# Patient Record
Sex: Male | Born: 1965 | Race: White | Hispanic: No | Marital: Married | Smoking: Never smoker
Health system: Southern US, Community
[De-identification: ages and names within clinical notes are randomized; demographics above are authoritative.]

## PROBLEM LIST (undated history)

## (undated) DIAGNOSIS — I10 Essential (primary) hypertension: Secondary | ICD-10-CM

## (undated) DIAGNOSIS — E119 Type 2 diabetes mellitus without complications: Secondary | ICD-10-CM

## (undated) DIAGNOSIS — A770 Spotted fever due to Rickettsia rickettsii: Secondary | ICD-10-CM

## (undated) DIAGNOSIS — I82409 Acute embolism and thrombosis of unspecified deep veins of unspecified lower extremity: Secondary | ICD-10-CM

## (undated) HISTORY — DX: Spotted fever due to Rickettsia rickettsii: A77.0

## (undated) HISTORY — DX: Type 2 diabetes mellitus without complications: E11.9

## (undated) HISTORY — DX: Acute embolism and thrombosis of unspecified deep veins of unspecified lower extremity: I82.409

## (undated) HISTORY — PX: TYMPANOPLASTY: SHX33

## (undated) HISTORY — PX: TONSILLECTOMY: SUR1361

---

## 2014-10-13 ENCOUNTER — Emergency Department: Payer: Self-pay | Admitting: Emergency Medicine

## 2014-11-26 DIAGNOSIS — I82409 Acute embolism and thrombosis of unspecified deep veins of unspecified lower extremity: Secondary | ICD-10-CM

## 2014-11-26 HISTORY — DX: Acute embolism and thrombosis of unspecified deep veins of unspecified lower extremity: I82.409

## 2016-10-25 ENCOUNTER — Encounter: Payer: BLUE CROSS/BLUE SHIELD | Attending: Internal Medicine | Admitting: *Deleted

## 2016-10-25 ENCOUNTER — Encounter: Payer: Self-pay | Admitting: *Deleted

## 2016-10-25 VITALS — BP 150/92 | Ht 71.0 in | Wt 235.8 lb

## 2016-10-25 DIAGNOSIS — Z713 Dietary counseling and surveillance: Secondary | ICD-10-CM | POA: Insufficient documentation

## 2016-10-25 DIAGNOSIS — E119 Type 2 diabetes mellitus without complications: Secondary | ICD-10-CM | POA: Insufficient documentation

## 2016-10-25 NOTE — Progress Notes (Signed)
Diabetes Self-Management Education  Visit Type: First/Initial  Appt. Start Time: 1535 Appt. End Time: 1640  10/25/2016  Gilbert Walker, identified by name and date of birth, is a 51 y.o. male with a diagnosis of Diabetes: Type 2.   ASSESSMENT  Blood pressure (!) 150/92, height 5\' 11"  (1.803 m), weight 235 lb 12.8 oz (107 kg). Body mass index is 32.89 kg/m.      Diabetes Self-Management Education - 10/25/16 1727      Visit Information   Visit Type First/Initial     Initial Visit   Diabetes Type Type 2   Are you currently following a meal plan? Yes   What type of meal plan do you follow? "cut out soda, decreased bread, sweets"   Are you taking your medications as prescribed? Yes  Pt thought he was to take 1500 mg Metformin per day. Per MD note - 2000 mg per day. He will begin today.    Date Diagnosed December 2017     Health Coping   How would you rate your overall health? Good     Psychosocial Assessment   Patient Belief/Attitude about Diabetes Motivated to manage diabetes   Self-care barriers None   Self-management support Doctor's office   Patient Concerns Nutrition/Meal planning;Medication;Monitoring;Healthy Lifestyle;Problem Solving;Glycemic Control;Weight Control   Special Needs None   Preferred Learning Style Auditory;Visual;Hands on   [place]eller in progress   How often do you need to have someone help you when you read instructions, pamphlets, or other written materials from your doctor or pharmacy? 1 - Never   What is the last grade level you completed in school? BA     Pre-Education Assessment   Patient understands the diabetes disease and treatment process. Needs Instruction   Patient understands incorporating nutritional management into lifestyle. Needs Instruction   Patient undertands incorporating physical activity into lifestyle. Needs Instruction   Patient understands using medications safely. Needs Instruction   Patient understands  monitoring blood glucose, interpreting and using results Needs Review   Patient understands prevention, detection, and treatment of acute complications. Needs Instruction   Patient understands prevention, detection, and treatment of chronic complications. Needs Instruction   Patient understands how to develop strategies to address psychosocial issues. Needs Instruction   Patient understands how to develop strategies to promote health/change behavior. Needs Instruction     Complications   Last HgB A1C per patient/outside source 7.9 %  08/23/16   How often do you check your blood sugar? 1-2 times/day   Fasting Blood glucose range (mg/dL) 70-129;130-179  Pt reports FBG's 124-169 mg/dL.   Have you had a dilated eye exam in the past 12 months? No   Have you had a dental exam in the past 12 months? Yes   Are you checking your feet? Yes   How many days per week are you checking your feet? 1     Dietary Intake   Breakfast skips or has cereal and milk or egg burrito    Snack (morning) tangerines or granola bar   Lunch left overs   Snack (afternoon) tangerine or granola bar   Dinner meat and veggies occasional bread and potatoes, pintos, turnip greens, [place] quesadilla with spinach, [place] stew, pasta   Beverage(s) water, unsweetened tea and coffee     Exercise   Exercise Type ADL's     Patient Education   Previous Diabetes Education No   Disease state  Definition of diabetes, type 1 and 2, and the diagnosis of diabetes  Nutrition management  Role of diet in the treatment of diabetes and the relationship between the three main macronutrients and blood glucose level;Reviewed blood glucose goals for pre and post meals and how to evaluate the patients' food intake on their blood glucose level.   Physical activity and exercise  Role of exercise on diabetes management, blood pressure control and cardiac health.   Medications Reviewed patients medication for diabetes, action, purpose, timing of  dose and side effects.   Monitoring Purpose and frequency of SMBG.;Taught/discussed recording of test results and interpretation of SMBG.;Identified appropriate SMBG and/or A1C goals.   Chronic complications Relationship between chronic complications and blood glucose control;Retinopathy and reason for yearly dilated eye exams   Psychosocial adjustment Identified and addressed patients feelings and concerns about diabetes     Individualized Goals (developed by patient)   Reducing Risk Improve blood sugars Decrease medications Prevent diabetes complications Lose weight Lead a healthier lifestyle Become more fit     Outcomes   Expected Outcomes Demonstrated interest in learning. Expect positive outcomes      Individualized Plan for Diabetes Self-Management Training:   Learning Objective:  Patient will have a greater understanding of diabetes self-management. Patient education plan is to attend individual and/or group sessions per assessed needs and concerns.   Plan:   Patient Instructions  Check blood sugars 1 x day before breakfast OR 2 hrs after one meal every day Exercise: Begin walking  for    15  minutes   3  days a week and gradually increase to 150 minutes/week Eat 3 meals day,  1-2 snacks a day Space meals 4-6 hours apart Don't skip meals Limit desserts/sweets Make an eye doctor appointment Bring blood sugar records to the next class   Expected Outcomes:  Demonstrated interest in learning. Expect positive outcomes  Education material provided:  General Meal Planning Guidelines Simple Meal Plan  If problems or questions, patient to contact team via:  Johny Drilling, [place], [place], CDE [phone]  Future DSME appointment:  Monday October 29, 2016 for Diabetes Class 1

## 2016-10-25 NOTE — Patient Instructions (Signed)
Check blood sugars 1 x day before breakfast OR 2 hrs after one meal every day   Exercise: Begin walking  for    15  minutes   3  days a week and gradually increase to 150 minutes/week  Eat 3 meals day,  1-2 snacks a day Space meals 4-6 hours apart Don't skip meals Limit desserts/sweets  Make an eye doctor appointment  Bring blood sugar records to the next class  Return for classes on:

## 2016-10-29 ENCOUNTER — Encounter: Payer: BLUE CROSS/BLUE SHIELD | Admitting: Dietician

## 2016-10-29 ENCOUNTER — Encounter: Payer: Self-pay | Admitting: Dietician

## 2016-10-29 VITALS — Ht 71.0 in | Wt 235.6 lb

## 2016-10-29 DIAGNOSIS — E119 Type 2 diabetes mellitus without complications: Secondary | ICD-10-CM

## 2016-10-29 DIAGNOSIS — Z713 Dietary counseling and surveillance: Secondary | ICD-10-CM | POA: Diagnosis not present

## 2016-10-29 NOTE — Progress Notes (Signed)

## 2016-11-12 ENCOUNTER — Encounter: Payer: BLUE CROSS/BLUE SHIELD | Admitting: *Deleted

## 2016-11-12 ENCOUNTER — Encounter: Payer: Self-pay | Admitting: *Deleted

## 2016-11-12 VITALS — Wt 238.6 lb

## 2016-11-12 DIAGNOSIS — Z713 Dietary counseling and surveillance: Secondary | ICD-10-CM | POA: Diagnosis not present

## 2016-11-12 DIAGNOSIS — E119 Type 2 diabetes mellitus without complications: Secondary | ICD-10-CM

## 2016-11-12 NOTE — Progress Notes (Signed)

## 2016-11-19 ENCOUNTER — Encounter: Payer: BLUE CROSS/BLUE SHIELD | Admitting: Dietician

## 2016-11-19 ENCOUNTER — Encounter: Payer: Self-pay | Admitting: Dietician

## 2016-11-19 VITALS — BP 130/84 | Ht 71.0 in | Wt 237.3 lb

## 2016-11-19 DIAGNOSIS — Z713 Dietary counseling and surveillance: Secondary | ICD-10-CM | POA: Diagnosis not present

## 2016-11-19 DIAGNOSIS — E119 Type 2 diabetes mellitus without complications: Secondary | ICD-10-CM

## 2016-11-19 NOTE — Progress Notes (Signed)

## 2016-12-07 ENCOUNTER — Encounter: Payer: Self-pay | Admitting: *Deleted

## 2017-05-02 ENCOUNTER — Encounter: Payer: Self-pay | Admitting: *Deleted

## 2017-05-03 ENCOUNTER — Encounter
Admission: RE | Disposition: A | Discharge status: Home / Self Care | Payer: Self-pay | Source: Ambulatory Visit | Attending: Unknown Physician Specialty

## 2017-05-03 ENCOUNTER — Encounter: Payer: Self-pay | Admitting: *Deleted

## 2017-05-03 ENCOUNTER — Ambulatory Visit: Payer: 59 | Admitting: Anesthesiology

## 2017-05-03 ENCOUNTER — Ambulatory Visit
Admission: RE | Admit: 2017-05-03 | Discharge: 2017-05-03 | Disposition: A | Discharge status: Home / Self Care | Payer: 59 | Source: Ambulatory Visit | Attending: Unknown Physician Specialty | Admitting: Unknown Physician Specialty

## 2017-05-03 DIAGNOSIS — K648 Other hemorrhoids: Secondary | ICD-10-CM | POA: Insufficient documentation

## 2017-05-03 DIAGNOSIS — D124 Benign neoplasm of descending colon: Secondary | ICD-10-CM | POA: Insufficient documentation

## 2017-05-03 DIAGNOSIS — Z7984 Long term (current) use of oral hypoglycemic drugs: Secondary | ICD-10-CM | POA: Diagnosis not present

## 2017-05-03 DIAGNOSIS — Z1211 Encounter for screening for malignant neoplasm of colon: Secondary | ICD-10-CM | POA: Insufficient documentation

## 2017-05-03 DIAGNOSIS — D120 Benign neoplasm of cecum: Secondary | ICD-10-CM | POA: Insufficient documentation

## 2017-05-03 DIAGNOSIS — D125 Benign neoplasm of sigmoid colon: Secondary | ICD-10-CM | POA: Insufficient documentation

## 2017-05-03 DIAGNOSIS — Z7982 Long term (current) use of aspirin: Secondary | ICD-10-CM | POA: Diagnosis not present

## 2017-05-03 DIAGNOSIS — E119 Type 2 diabetes mellitus without complications: Secondary | ICD-10-CM | POA: Diagnosis not present

## 2017-05-03 DIAGNOSIS — Z86718 Personal history of other venous thrombosis and embolism: Secondary | ICD-10-CM | POA: Insufficient documentation

## 2017-05-03 HISTORY — PX: COLONOSCOPY WITH PROPOFOL: SHX5780

## 2017-05-03 LAB — GLUCOSE, CAPILLARY: Glucose-Capillary: 134 mg/dL — ABNORMAL HIGH (ref 65–99)

## 2017-05-03 SURGERY — COLONOSCOPY WITH PROPOFOL
Anesthesia: General

## 2017-05-03 MED ORDER — SODIUM CHLORIDE 0.9 % IV SOLN: INTRAVENOUS | Status: DC

## 2017-05-03 MED ORDER — LIDOCAINE HCL (PF) 2 % IJ SOLN
INTRAMUSCULAR | Status: AC
  Filled 2017-05-03: qty 2

## 2017-05-03 MED ORDER — LIDOCAINE HCL (CARDIAC) 20 MG/ML IV SOLN
INTRAVENOUS | Status: DC | PRN
  Administered 2017-05-03: 40 mg via INTRAVENOUS

## 2017-05-03 MED ORDER — PROPOFOL 500 MG/50ML IV EMUL
INTRAVENOUS | Status: AC
  Filled 2017-05-03: qty 50

## 2017-05-03 MED ORDER — PROPOFOL 10 MG/ML IV BOLUS
INTRAVENOUS | Status: DC | PRN
  Administered 2017-05-03: 40 mg via INTRAVENOUS
  Administered 2017-05-03: 10 mg via INTRAVENOUS
  Administered 2017-05-03: 30 mg via INTRAVENOUS

## 2017-05-03 MED ORDER — PROPOFOL 500 MG/50ML IV EMUL
INTRAVENOUS | Status: DC | PRN
  Administered 2017-05-03: 140 ug/kg/min via INTRAVENOUS

## 2017-05-03 MED ORDER — SODIUM CHLORIDE 0.9 % IV SOLN
INTRAVENOUS | Status: DC
  Administered 2017-05-03: 09:00:00 via INTRAVENOUS

## 2017-05-03 NOTE — Anesthesia Postprocedure Evaluation (Signed)
Anesthesia Post Note  Patient: Gilbert Walker  Procedure(s) Performed: Procedure(s) (LRB): COLONOSCOPY WITH PROPOFOL (N/A)  Patient location during evaluation: Endoscopy Anesthesia Type: General Level of consciousness: awake and alert Pain management: pain level controlled Vital Signs Assessment: post-procedure vital signs reviewed and stable Respiratory status: spontaneous breathing, nonlabored ventilation, respiratory function stable and patient connected to nasal cannula oxygen Cardiovascular status: blood pressure returned to baseline and stable Postop Assessment: no signs of nausea or vomiting Anesthetic complications: no     Last Vitals:  Vitals:   05/03/17 1117 05/03/17 1141  BP: 104/64 128/71  Pulse: 83 70  Resp: 15 20  Temp: (!) 36.2 C   SpO2: 100% 99%    Last Pain:  Vitals:   05/03/17 1117  TempSrc: Tympanic                 Precious Haws Piscitello

## 2017-05-03 NOTE — Anesthesia Preprocedure Evaluation (Signed)
Anesthesia Evaluation  Patient identified by MRN, date of birth, ID band Patient awake    Reviewed: Allergy & Precautions, H&P , NPO status , Patient's Chart, lab work & pertinent test results  History of Anesthesia Complications Negative for: history of anesthetic complications  Airway Mallampati: III  TM Distance: <3 FB Neck ROM: full    Dental  (+) Poor Dentition, Chipped   Pulmonary neg pulmonary ROS, neg shortness of breath,           Cardiovascular Exercise Tolerance: Good (-) angina(-) Past MI and (-) DOE negative cardio ROS       Neuro/Psych negative neurological ROS  negative psych ROS   GI/Hepatic negative GI ROS, Neg liver ROS, neg GERD  ,  Endo/Other  diabetes, Type 2  Renal/GU negative Renal ROS  negative genitourinary   Musculoskeletal   Abdominal   Peds  Hematology negative hematology ROS (+)   Anesthesia Other Findings Signs and symptoms suggestive of sleep apnea   Past Medical History: No date: Diabetes mellitus without complication (HCC) No date: DVT (deep venous thrombosis) (HCC) No date: [facility] spotted fever  Past Surgical History: No date: TYMPANOPLASTY  BMI    Body Mass Index:  33.05 kg/m      Reproductive/Obstetrics negative OB ROS                             Anesthesia Physical Anesthesia Plan  ASA: III  Anesthesia Plan: General   Post-op Pain Management:    Induction: Intravenous  PONV Risk Score and Plan: Propofol infusion  Airway Management Planned: Natural Airway and Nasal Cannula  Additional Equipment:   Intra-op Plan:   Post-operative Plan:   Informed Consent: I have reviewed the patients History and Physical, chart, labs and discussed the procedure including the risks, benefits and alternatives for the proposed anesthesia with the patient or authorized representative who has indicated his/her understanding and acceptance.    Dental Advisory Given  Plan Discussed with: Anesthesiologist, CRNA and Surgeon  Anesthesia Plan Comments: (Patient consented for risks of anesthesia including but not limited to:  - adverse reactions to medications - risk of intubation if required - damage to teeth, lips or other oral mucosa - sore throat or hoarseness - Damage to heart, brain, lungs or loss of life  Patient voiced understanding.)        Anesthesia Quick Evaluation

## 2017-05-03 NOTE — Anesthesia Procedure Notes (Signed)
Date/Time: 05/03/2017 10:44 AM Performed by: Johnna Acosta Pre-anesthesia Checklist: Patient identified, Emergency Drugs available, Suction available, Patient being monitored and Timeout performed Patient Re-evaluated:Patient Re-evaluated prior to induction

## 2017-05-03 NOTE — Transfer of Care (Signed)
Immediate Anesthesia Transfer of Care Note  Patient: Gilbert Walker  Procedure(s) Performed: Procedure(s): COLONOSCOPY WITH PROPOFOL (N/A)  Patient Location: PACU  Anesthesia Type:General  Level of Consciousness: awake and alert   Airway & Oxygen Therapy: Patient Spontanous Breathing and Patient connected to nasal cannula oxygen  Post-op Assessment: Report given to RN and Post -op Vital signs reviewed and stable  Post vital signs: Reviewed and stable  Last Vitals:  Vitals:   05/03/17 0817 05/03/17 1117  BP: (!) 153/105 104/64  Pulse: 72 83  Resp: 16 15  Temp: 36.6 C (!) 36.2 C  SpO2: 100% 100%    Last Pain:  Vitals:   05/03/17 1117  TempSrc: Tympanic         Complications: No apparent anesthesia complications

## 2017-05-03 NOTE — Anesthesia Post-op Follow-up Note (Signed)
Anesthesia QCDR form completed.        

## 2017-05-03 NOTE — Op Note (Signed)
[facility] Gastroenterology Patient Name: Gilbert Walker Procedure Date: 05/03/2017 10:44 AM MRN: [id number] Account #: [id number] Date of Birth: 02-13-1966 Admit Type: Outpatient Age: 51 Room: [facility] ENDO ROOM 1 Gender: Male Note Status: Finalized Procedure:            Colonoscopy Indications:          Screening for colorectal malignant neoplasm Providers:            Manya Silvas, MD Referring MD:         Leonie Douglas. Doy Hutching, MD (Referring MD) Medicines:            Propofol per Anesthesia Complications:        No immediate complications. Procedure:            Pre-Anesthesia Assessment:                       - After reviewing the risks and benefits, the patient                        was deemed in satisfactory condition to undergo the                        procedure.                       After obtaining informed consent, the colonoscope was                        passed under direct vision. Throughout the procedure,                        the patient's blood pressure, pulse, and oxygen                        saturations were monitored continuously. The                        Colonoscope was introduced through the anus and                        advanced to the the cecum, identified by appendiceal                        orifice and ileocecal valve. The colonoscopy was                        performed without difficulty. The patient tolerated the                        procedure well. The quality of the bowel preparation                        was excellent. Findings:      A small polyp was found in the sigmoid colon. The polyp was sessile. The       polyp was removed with a hot snare. Resection and retrieval were       complete.      A diminutive polyp was found in the descending colon. The polyp was       sessile. The polyp was removed with a jumbo cold forceps. Resection and  retrieval were complete.      A diminutive polyp was found in the cecum. The  polyp was sessile. The       polyp was removed with a jumbo cold forceps. Resection and retrieval       were complete. Impression:           - One small polyp in the sigmoid colon, removed with a                        hot snare. Resected and retrieved.                       - One diminutive polyp in the descending colon, removed                        with a jumbo cold forceps. Resected and retrieved.                       - One diminutive polyp in the cecum, removed with a                        jumbo cold forceps. Resected and retrieved. Recommendation:       - Await pathology results. Manya Silvas, MD 05/03/2017 11:14:03 AM This report has been signed electronically. Number of Addenda: 0 Note Initiated On: 05/03/2017 10:44 AM Scope Withdrawal Time: 0 hours 10 minutes 34 seconds  Total Procedure Duration: 0 hours 17 minutes 27 seconds       [facility]

## 2017-05-03 NOTE — H&P (Signed)
   Primary Care Physician:  Idelle Crouch, MD Primary Gastroenterologist:  Dr. Vira Agar  Pre-Procedure History & Physical: HPI:  Gilbert Walker is a 51 y.o. male is here for an colonoscopy.   Past Medical History:  Diagnosis Date  . Diabetes mellitus without complication ([place])   . DVT (deep venous thrombosis) ([place])   . [place] spotted fever     Past Surgical History:  Procedure Laterality Date  . TYMPANOPLASTY      Prior to Admission medications   Medication Sig Start Date End Date Taking? Authorizing Provider  aspirin EC 81 MG tablet Take 81 mg by mouth daily.    Yes [provider]  glimepiride (AMARYL) 4 MG tablet Take 4 mg by mouth daily with breakfast.  10/02/16 10/02/17 Yes [provider]  metFORMIN (GLUCOPHAGE-XR) 500 MG 24 hr tablet Take 1,000 mg by mouth 2 (two) times daily.  10/02/16 10/02/17  [provider]  Pseudoephedrine-Acetaminophen (SINUS PO) Take 1 tablet by mouth daily.    [provider]    Allergies as of 01/04/2017  . (No Known Allergies)    Family History  Problem Relation Age of Onset  . Diabetes Father     Social History   Social History  . Marital status: Married    Spouse name: N/A  . Number of children: N/A  . Years of education: N/A   Occupational History  . Not on file.   Social History Main Topics  . Smoking status: Never Smoker  . Smokeless tobacco: Never Used  . Alcohol use 0.6 oz/week    1 Cans of beer per week     Comment: occasionally  . Drug use: No  . Sexual activity: Not on file   Other Topics Concern  . Not on file   Social History Narrative  . No narrative on file    Review of Systems: See HPI, otherwise negative ROS  Physical Exam: BP (!) 153/105   Pulse 72   Temp 97.9 F (36.6 C) (Tympanic)   Resp 16   Ht 5\' 11"  (1.803 m)   Wt 107.5 kg (237 lb)   SpO2 100%   BMI 33.05 kg/m  General:   Alert,  pleasant and cooperative in NAD Head:  Normocephalic and  atraumatic. Neck:  Supple; no masses or thyromegaly. Lungs:  Clear throughout to auscultation.    Heart:  Regular rate and rhythm. Abdomen:  Soft, nontender and nondistended. Normal bowel sounds, without guarding, and without rebound.   Neurologic:  Alert and  oriented x4;  grossly normal neurologically.  Impression/Plan: Gilbert Walker is here for an colonoscopy to be performed for screening colonoscopy  Risks, benefits, limitations, and alternatives regarding  colonoscopy have been reviewed with the patient.  Questions have been answered.  All parties agreeable.   Gaylyn Cheers, MD  05/03/2017, 10:46 AM

## 2017-05-06 ENCOUNTER — Encounter: Payer: Self-pay | Admitting: Unknown Physician Specialty

## 2017-05-06 LAB — SURGICAL PATHOLOGY

## 2020-04-12 ENCOUNTER — Other Ambulatory Visit: Payer: Self-pay | Admitting: Internal Medicine

## 2020-04-12 DIAGNOSIS — M79651 Pain in right thigh: Secondary | ICD-10-CM

## 2020-04-18 ENCOUNTER — Ambulatory Visit
Admission: RE | Admit: 2020-04-18 | Discharge: 2020-04-18 | Disposition: A | Discharge status: Home / Self Care | Payer: BC Managed Care – PPO | Source: Ambulatory Visit | Attending: Internal Medicine | Admitting: Internal Medicine

## 2020-04-18 ENCOUNTER — Other Ambulatory Visit: Payer: Self-pay

## 2020-04-18 DIAGNOSIS — M79651 Pain in right thigh: Secondary | ICD-10-CM | POA: Diagnosis not present

## 2021-04-14 ENCOUNTER — Other Ambulatory Visit: Payer: Self-pay | Admitting: Internal Medicine

## 2021-04-14 DIAGNOSIS — R1011 Right upper quadrant pain: Secondary | ICD-10-CM

## 2021-05-11 ENCOUNTER — Other Ambulatory Visit: Payer: Self-pay

## 2021-05-11 ENCOUNTER — Ambulatory Visit
Admission: RE | Admit: 2021-05-11 | Discharge: 2021-05-11 | Disposition: A | Discharge status: Home / Self Care | Payer: BC Managed Care – PPO | Source: Ambulatory Visit | Attending: Internal Medicine | Admitting: Internal Medicine

## 2021-05-11 DIAGNOSIS — R1011 Right upper quadrant pain: Secondary | ICD-10-CM | POA: Diagnosis present

## 2021-05-26 HISTORY — PX: TRIGGER FINGER RELEASE: SHX641

## 2021-12-13 IMAGING — US US EXTREM LOW VENOUS*R*
1 series · 15 of 24 positions shown · non-contrast
Comparison: None.

CLINICAL DATA: 54-year-old male with a history of right thigh pain



[Series 1: us extrem low venous*right* · 33 acquisitions, 15 frames shown]
[im 1/33]
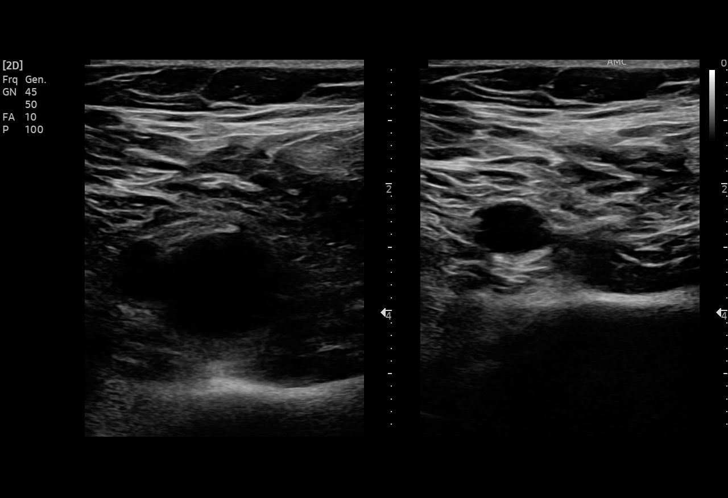
[im 3/33]
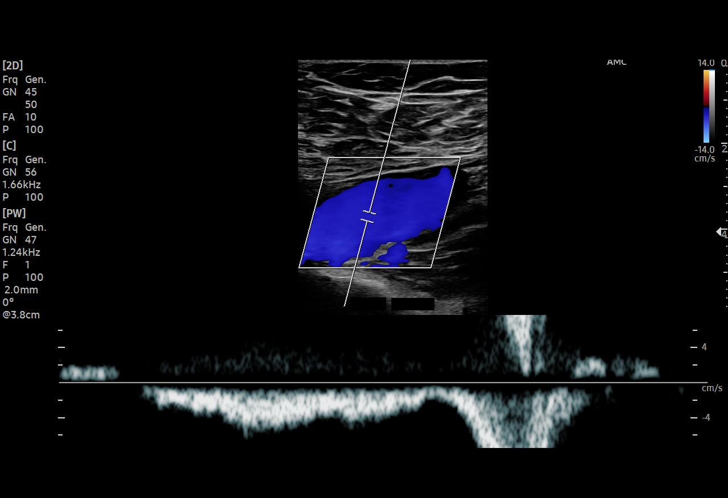
[im 6/33]
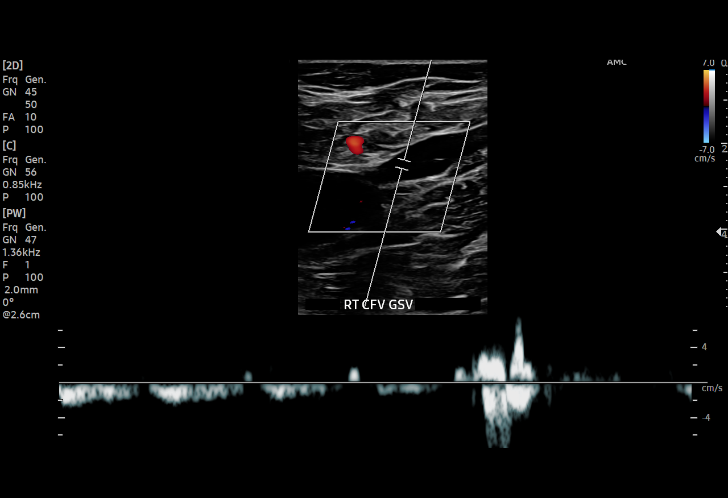
[im 7/33]
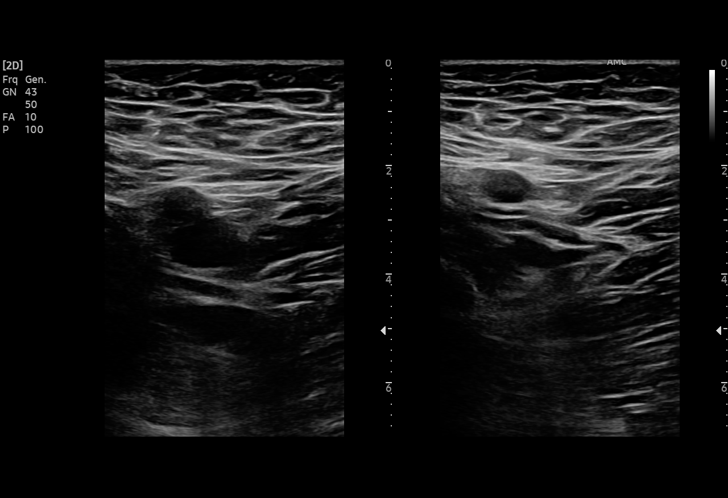
[im 10/33]
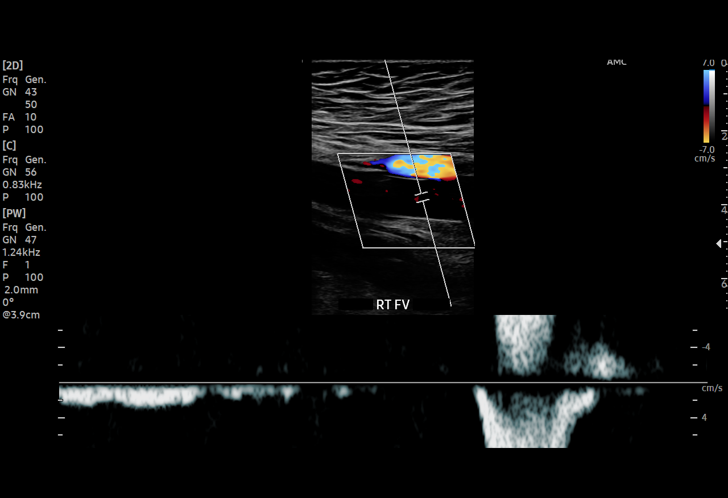
[im 12/33]
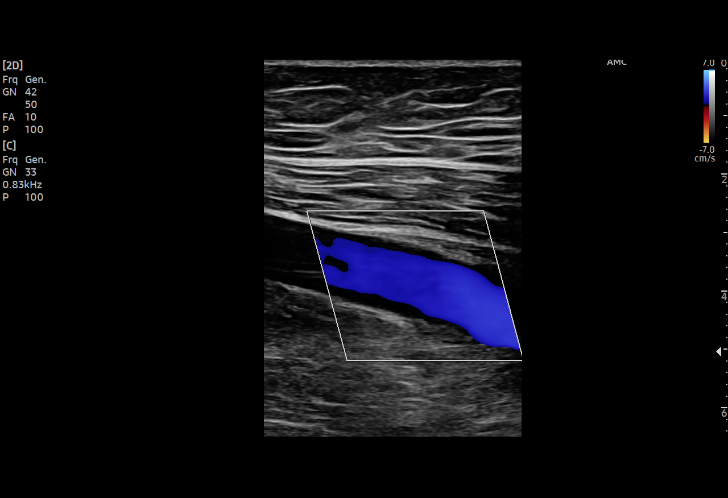
[im 14/33]
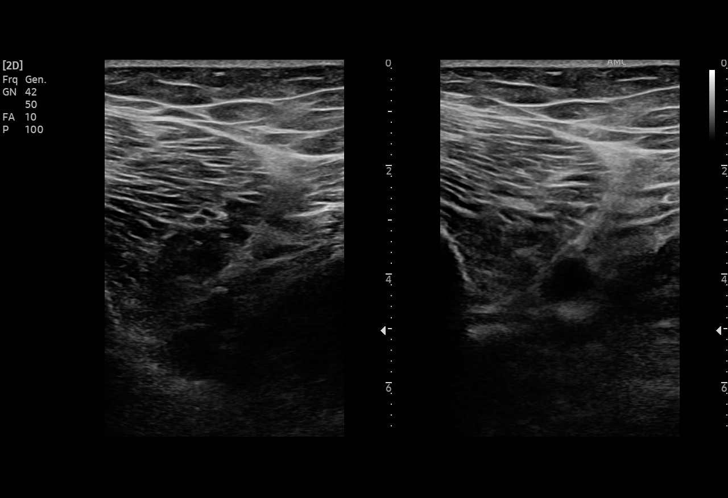
[im 19/33]
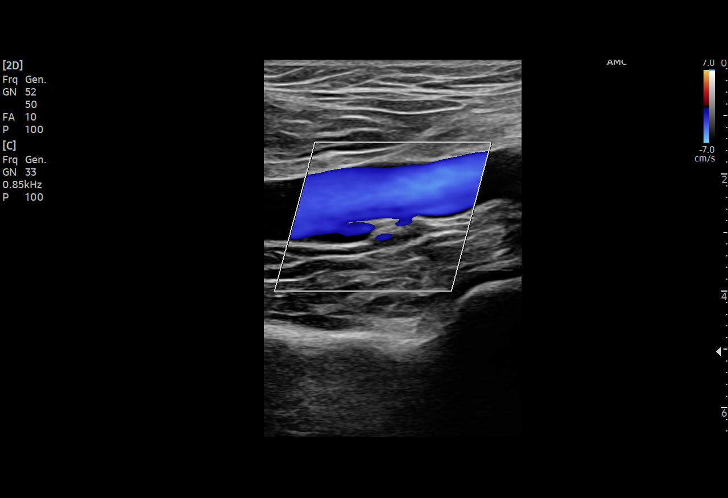
[im 20/33]
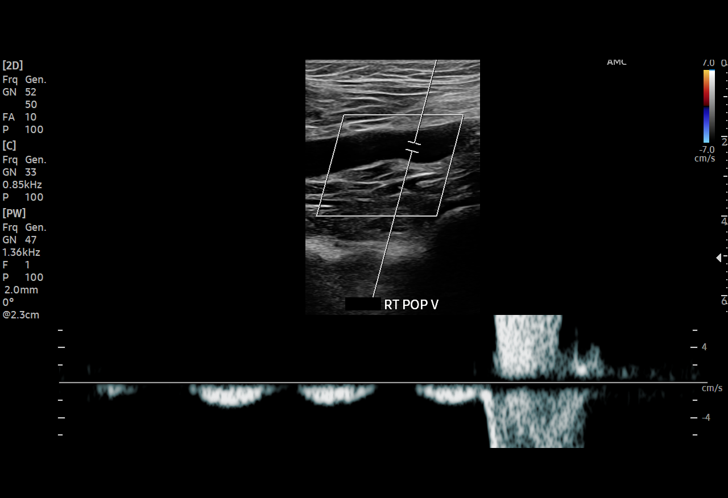
[im 23/33]
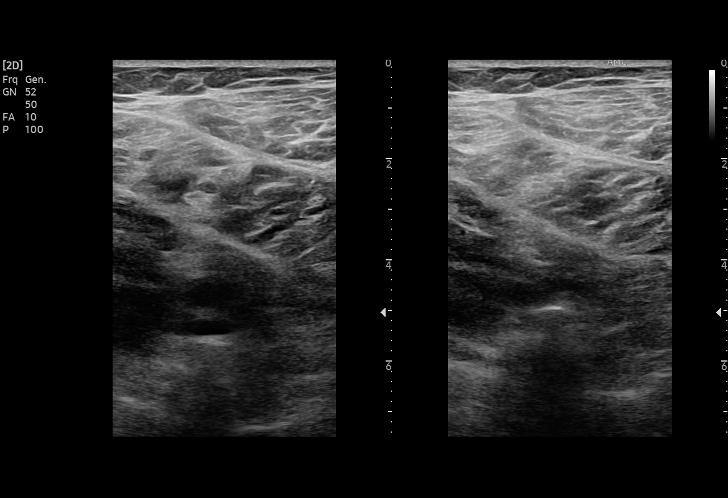
[im 24/33]
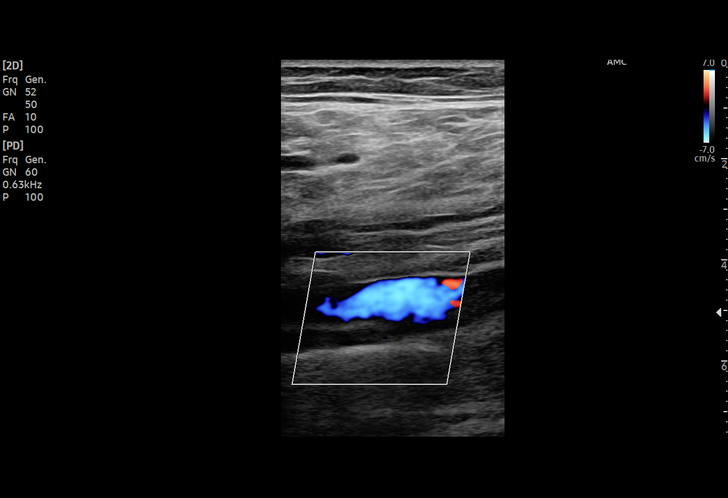
[im 27/33]
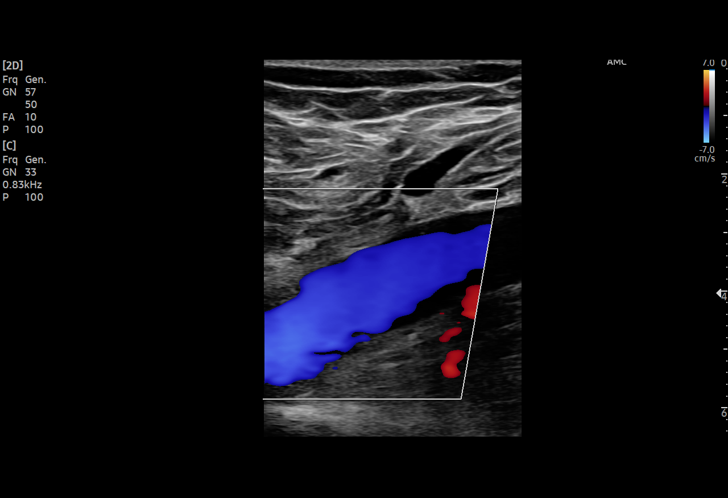
[im 30/33]
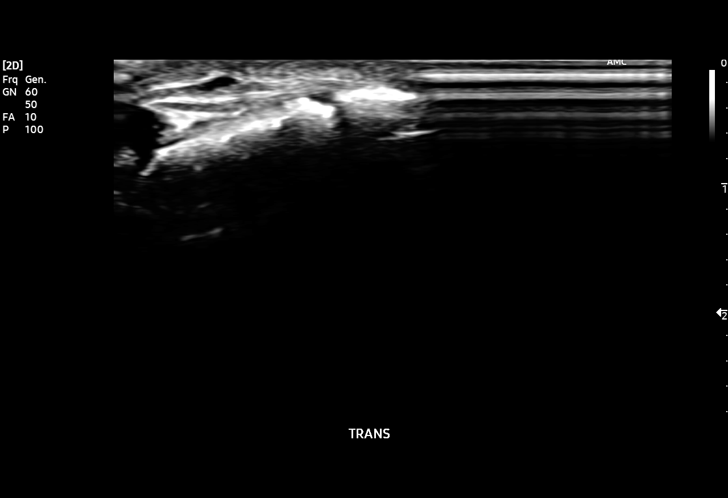
[im 31/33]
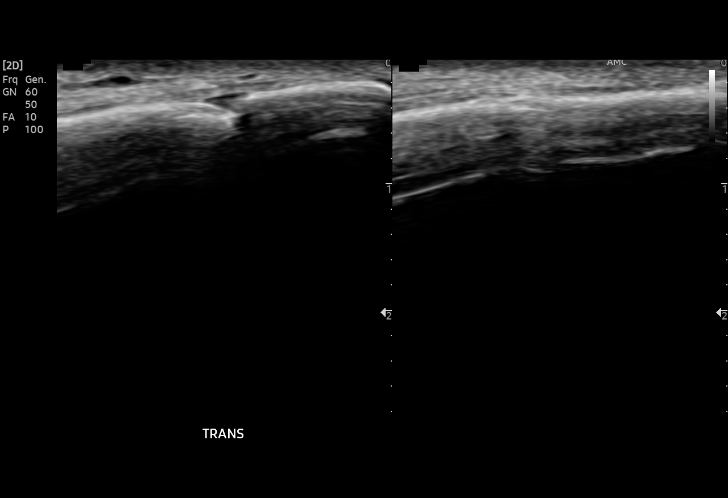
[im 33/33]
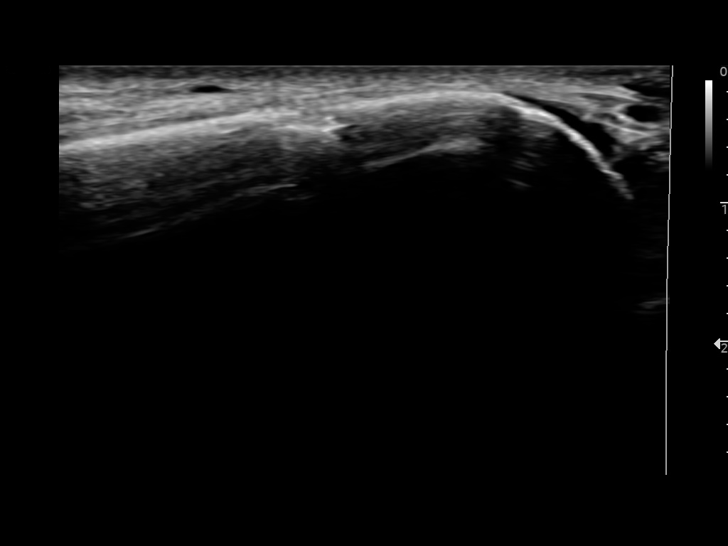

[15 of 24 positions shown; findings below may reference images not displayed]

FINDINGS: Contralateral Common Femoral Vein: Respiratory phasicity is normal
and symmetric with the symptomatic side. No evidence of thrombus.
Normal compressibility.

Common Femoral Vein: No evidence of thrombus. Normal
compressibility, respiratory phasicity and response to augmentation.

Saphenofemoral Junction: No evidence of thrombus. Normal
compressibility and flow on color Doppler imaging.

Profunda Femoral Vein: No evidence of thrombus. Normal
compressibility and flow on color Doppler imaging.

Femoral Vein: No evidence of thrombus. Normal compressibility,
respiratory phasicity and response to augmentation.

Popliteal Vein: No evidence of thrombus. Normal compressibility,
respiratory phasicity and response to augmentation.

Calf Veins: No evidence of thrombus. Normal compressibility and flow
on color Doppler imaging.

Superficial Great Saphenous Vein: No evidence of thrombus. Normal
compressibility and flow on color Doppler imaging.

Other Findings:  None.
IMPRESSION: Sonographic survey of the right lower extremity negative for DVT

## 2022-12-13 ENCOUNTER — Other Ambulatory Visit: Payer: Self-pay | Admitting: Orthopedic Surgery

## 2022-12-13 DIAGNOSIS — M25512 Pain in left shoulder: Secondary | ICD-10-CM

## 2022-12-20 ENCOUNTER — Encounter: Payer: Self-pay | Admitting: Orthopedic Surgery

## 2022-12-27 ENCOUNTER — Ambulatory Visit
Admission: RE | Admit: 2022-12-27 | Discharge: 2022-12-27 | Disposition: A | Discharge status: Home / Self Care | Payer: BC Managed Care – PPO | Source: Ambulatory Visit | Attending: Orthopedic Surgery | Admitting: Orthopedic Surgery

## 2022-12-27 DIAGNOSIS — M25512 Pain in left shoulder: Secondary | ICD-10-CM

## 2023-01-05 IMAGING — US US ABDOMEN COMPLETE
1 series · 14 of 25 positions shown · non-contrast
Comparison: None

CLINICAL DATA: Abdominal pain right upper quadrant

EXAM:
COMPLETE ABDOMINAL ULTRASOUND

[Series 1: us abdomen complete · 0.25mm/px · 14 of 92 slices shown]
[im 1/92]
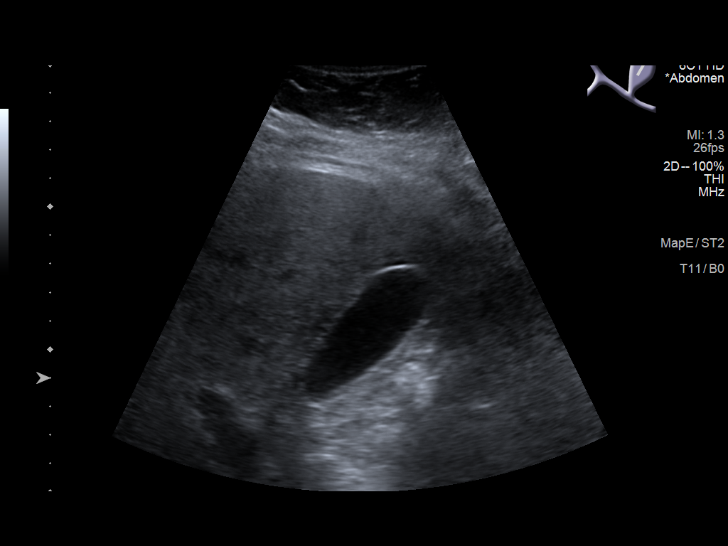
[im 8/92]
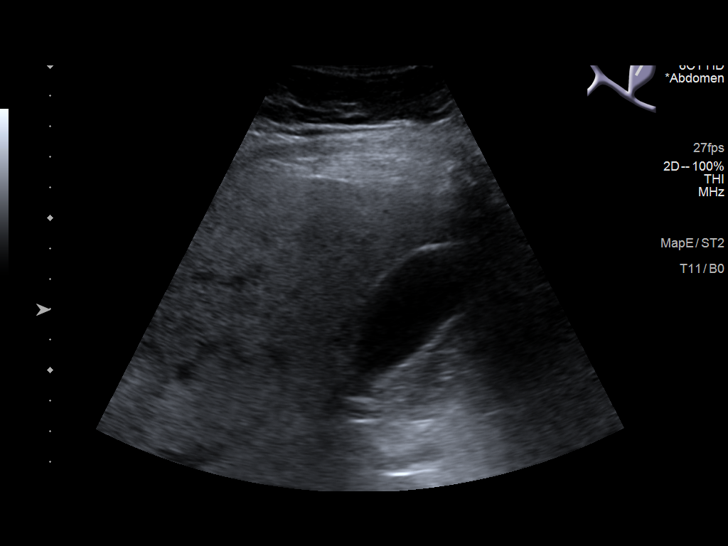
[im 16/92]
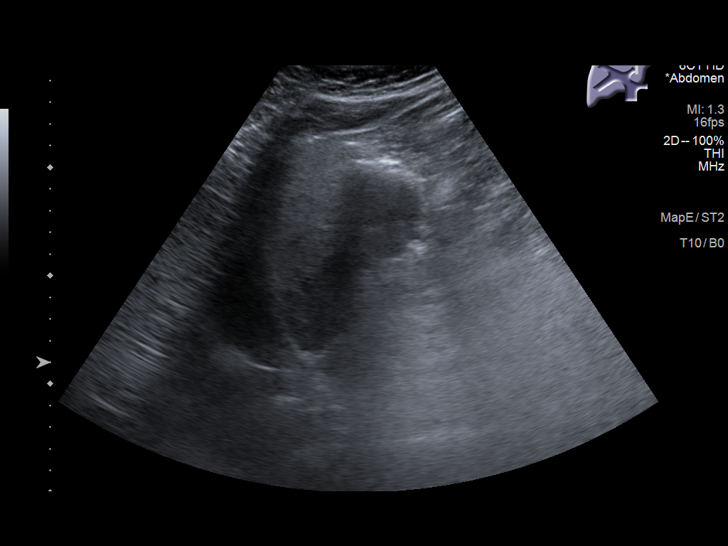
[im 23/92]
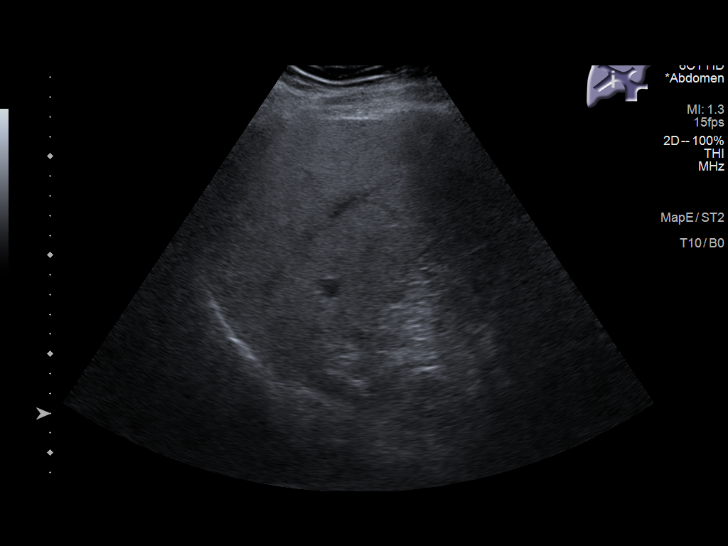
[im 31/92]
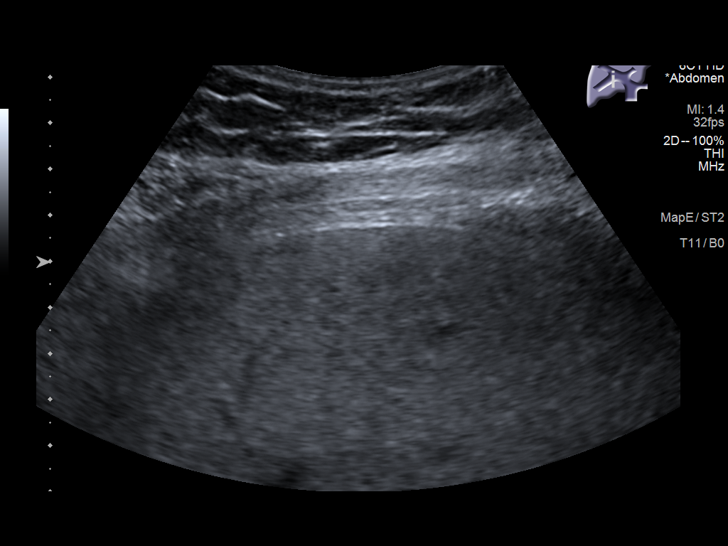
[im 35/92]
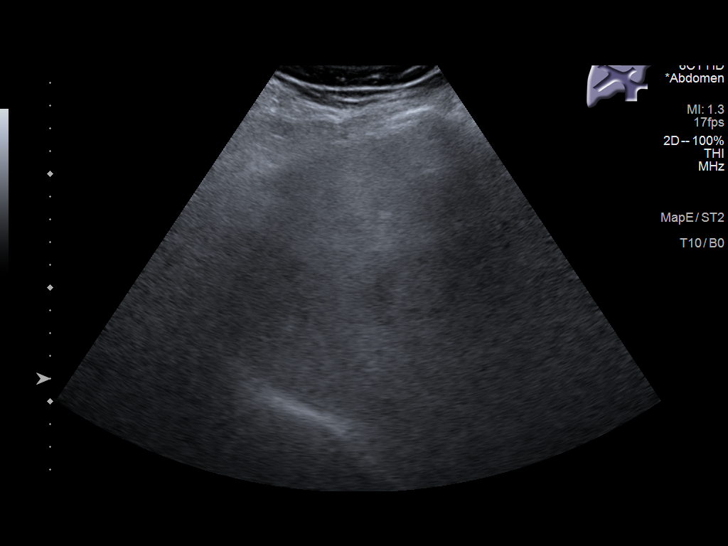
[im 42/92]
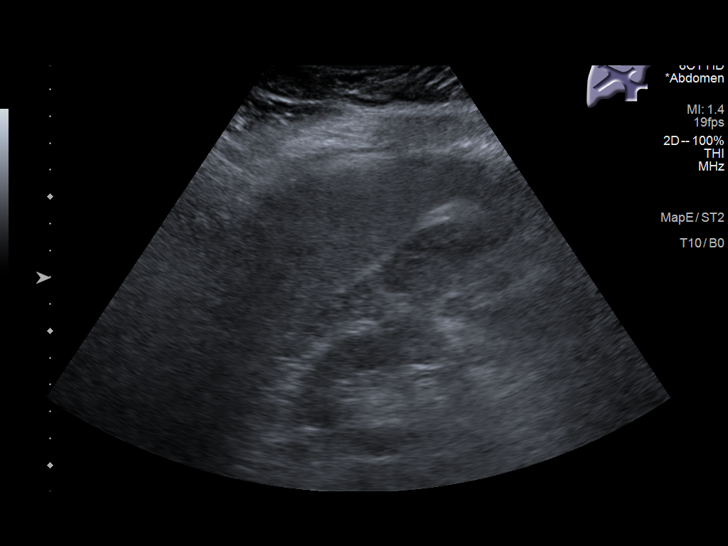
[im 50/92]
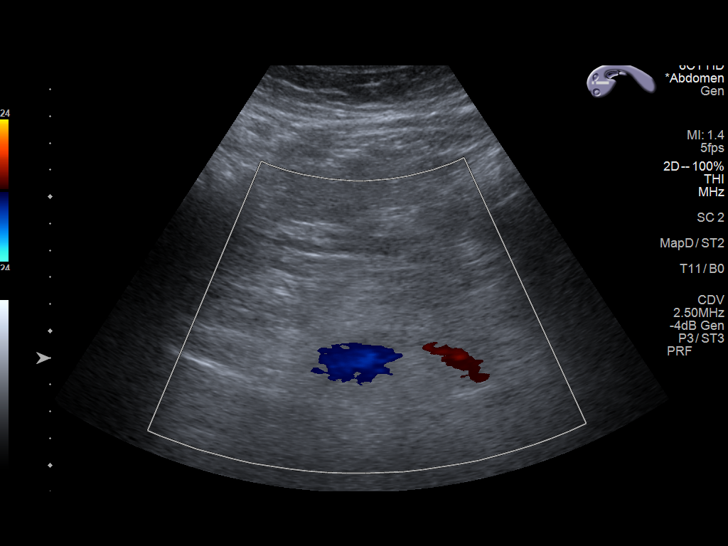
[im 57/92]
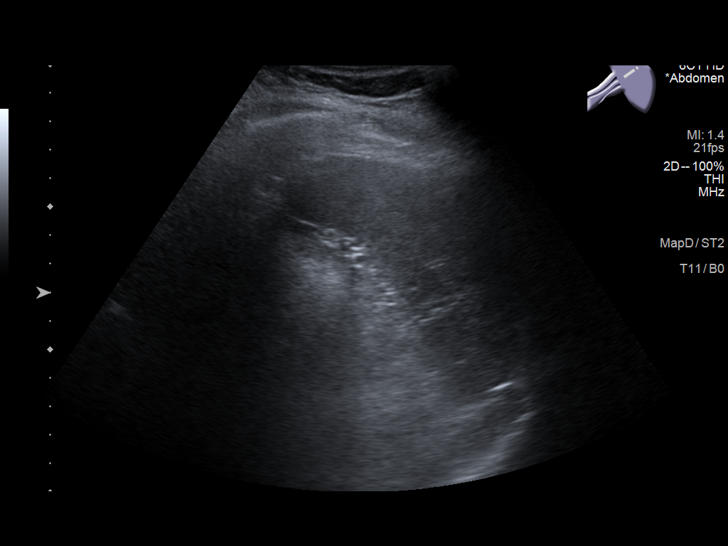
[im 61/92]
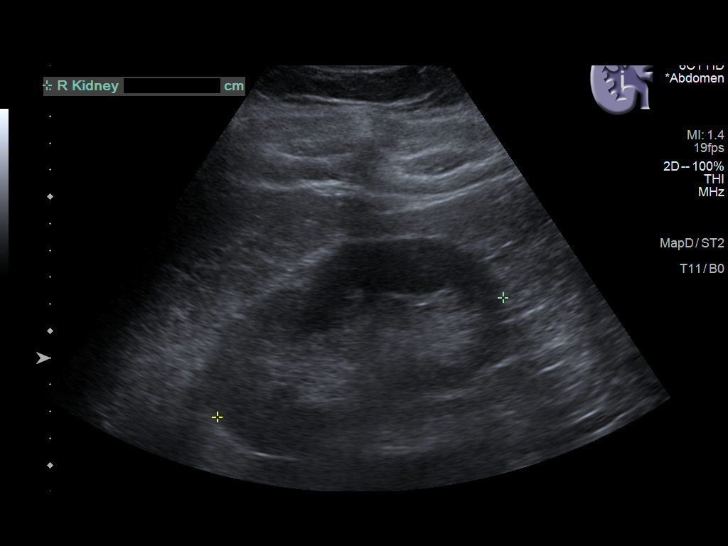
[im 69/92]
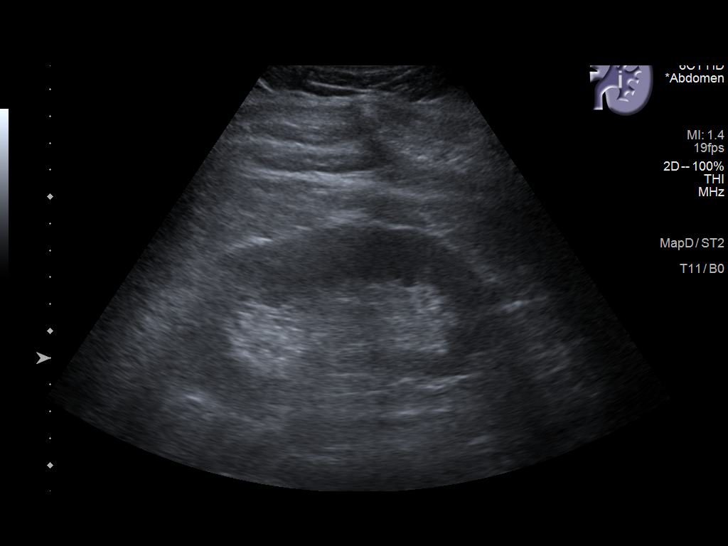
[im 76/92]
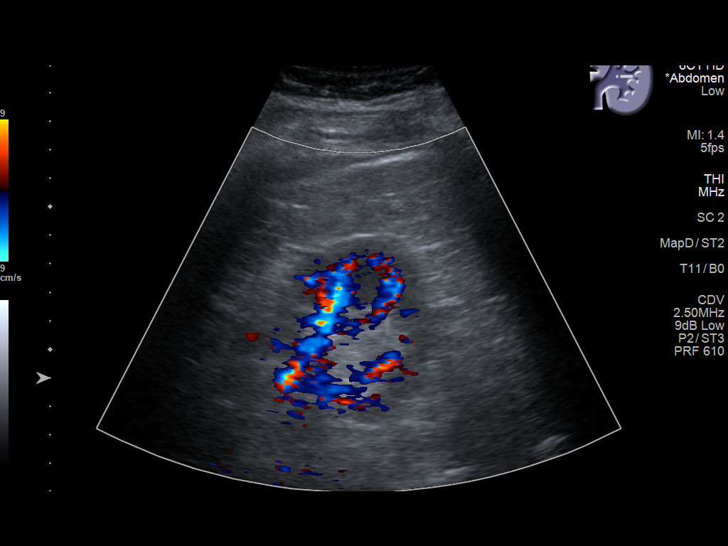
[im 84/92]
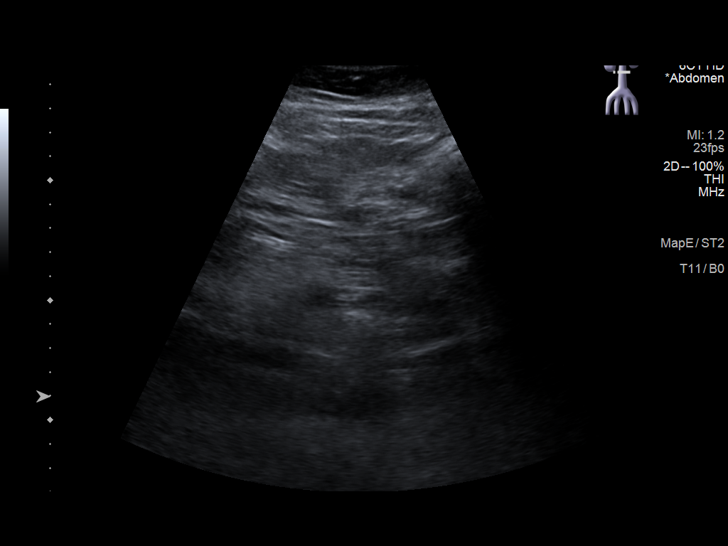
[im 92/92]
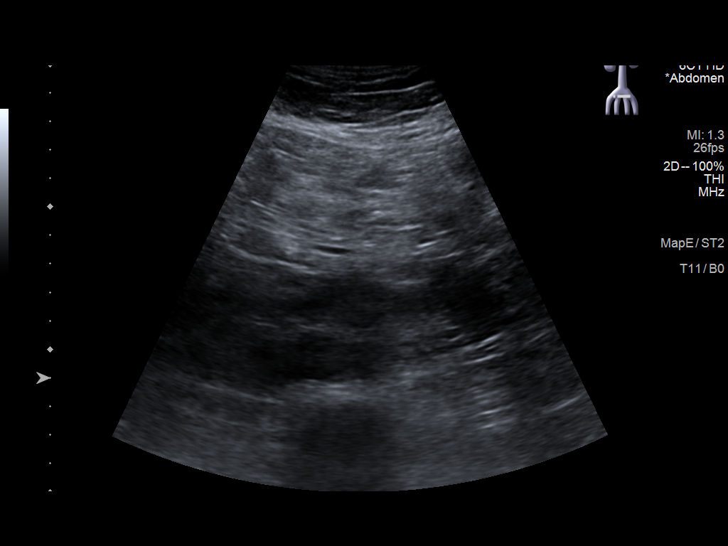

[14 of 25 positions shown; findings below may reference images not displayed]

FINDINGS: Gallbladder: Physiologically distended without stones, wall
thickening, or pericholecystic fluid. Sonographer reports no
sonographic Murphy's sign.

Common bile duct:  Normal in caliber, 4.3mm diameter.

Liver: Increased echogenicity with heterogenous echotexture, no
focal lesion or biliary ductal dilatation. Antegrade color flow
signal in the main portal vein.

IVC:  Negative

Pancreas: Visualized segments unremarkable, portions obscured by
overlying bowel gas.

Spleen:  No focal lesion, craniocaudal 7.6cm in length.

Right Kidney:  No mass or hydronephrosis, 11.5cm in length.

Left Kidney:  No lesion or hydronephrosis, 11.3 cm length

Abdominal aorta:  Negative
IMPRESSION: 1. Normal gallbladder.
2. Echogenic heterogenous liver parenchyma, a nonspecific finding
often associated with steatohepatitis.

## 2023-01-31 ENCOUNTER — Ambulatory Visit: Payer: BC Managed Care – PPO

## 2023-01-31 DIAGNOSIS — K640 First degree hemorrhoids: Secondary | ICD-10-CM

## 2023-01-31 DIAGNOSIS — Z09 Encounter for follow-up examination after completed treatment for conditions other than malignant neoplasm: Secondary | ICD-10-CM

## 2023-01-31 DIAGNOSIS — K5730 Diverticulosis of large intestine without perforation or abscess without bleeding: Secondary | ICD-10-CM

## 2023-01-31 DIAGNOSIS — Z86010 Personal history of colonic polyps: Secondary | ICD-10-CM

## 2023-02-19 ENCOUNTER — Other Ambulatory Visit: Payer: Self-pay | Admitting: Orthopedic Surgery

## 2023-03-13 NOTE — Discharge Instructions (Addendum)
Post-Op Instructions - Rotator Cuff Repair  1. Bracing: You will wear a shoulder immobilizer or sling for 4 weeks.   2. Driving: No driving for 4 weeks post-op.  3. Activity: No active lifting for 2 months. Wrist, hand, and elbow motion only. Avoid lifting the upper arm away from the body except for hygiene. You are permitted to bend and straighten the elbow passively only (no active elbow motion). You may use your hand and wrist for typing, writing, and managing utensils (cutting food). Do not lift more than a coffee cup for 8 weeks.  When sleeping or resting, inclined positions (recliner chair or wedge pillow) and a pillow under the forearm for support may provide better comfort for up to 4 weeks.  Avoid long distance travel for 4 weeks.  Return to normal activities after rotator cuff repair repair normally takes 6 months on average. If rehab goes very well, may be able to do most activities at 4 months, except overhead or contact sports.  4. Physical Therapy: Begins 3-4 days after surgery, and proceed 1 time per week for the first 6 weeks, then 1-2 times per week from weeks 6-20 post-op.  5. Medications:  - You will be provided a prescription for narcotic pain medicine. After surgery, take 1-2 narcotic tablets every 4 hours if needed for severe pain.  - A prescription for anti-nausea medication will be provided in case the narcotic medicine causes nausea - take 1 tablet every 6 hours only if nauseated.   - Take tylenol 1000 mg (2 Extra Strength tablets or 3 regular strength) every 8 hours for pain.  May decrease or stop tylenol 5 days after surgery if you are having minimal pain. - Take ASA 325mg /day x 2 weeks to help prevent DVTs/PEs (blood clots).  - DO NOT take ANY nonsteroidal anti-inflammatory pain medications (Advil, Motrin, Ibuprofen, Aleve, Naproxen, or Naprosyn). These medicines can inhibit healing of your shoulder repair.    If you are taking prescription medication for anxiety,  depression, insomnia, muscle spasm, chronic pain, or for attention deficit disorder, you are advised that you are at a higher risk of adverse effects with use of narcotics post-op, including narcotic addiction/dependence, depressed breathing, death. If you use non-prescribed substances: alcohol, marijuana, cocaine, heroin, methamphetamines, etc., you are at a higher risk of adverse effects with use of narcotics post-op, including narcotic addiction/dependence, depressed breathing, death. You are advised that taking > 50 morphine milligram equivalents (MME) of narcotic pain medication per day results in twice the risk of overdose or death. For your prescription provided: oxycodone 5 mg - taking more than 6 tablets per day would result in > 50 morphine milligram equivalents (MME) of narcotic pain medication. Be advised that we will prescribe narcotics short-term, for acute post-operative pain only - 3 weeks for major operations such as shoulder repair/reconstruction surgeries.     6. Post-Op Appointment:  Your first post-op appointment will be 10-14 days post-op.  7. Work or School: For most, but not all procedures, we advise staying out of work or school for at least 1 to 2 weeks in order to recover from the stress of surgery and to allow time for healing.   If you need a work or school note this can be provided.   8. Smoking: If you are a smoker, you need to refrain from smoking in the postoperative period. The nicotine in cigarettes will inhibit healing of your shoulder repair and decrease the chance of successful repair. Similarly, nicotine containing products (  gum, patches) should be avoided.   Post-operative Brace: Apply and remove the brace you received as you were instructed to at the time of fitting and as described in detail as the brace's instructions for use indicate.  Wear the brace for the period of time prescribed by your physician.  The brace can be cleaned with soap and water and  allowed to air dry only.  Should the brace result in increased pain, decreased feeling (numbness/tingling), increased swelling or an overall worsening of your medical condition, please contact your doctor immediately.  If an emergency situation occurs as a result of wearing the brace after normal business hours, please dial 911 and seek immediate medical attention.  Let your doctor know if you have any further questions about the brace issued to you. Refer to the shoulder sling instructions for use if you have any questions regarding the correct fit of your shoulder sling.  [facility] Customer Care for Troubleshooting: [phone]  Video that illustrates how to properly use a shoulder sling: "Instructions for Proper Use of an Orthopaedic Sling" http://bass.com/       PERIPHERAL NERVE BLOCK PATIENT INFORMATION  Your surgeon has requested a peripheral nerve block for your surgery. This anesthetic technique provides excellent post-operative pain relief for you in a safe and effective manner. It will also help reduce the risk of nausea and vomiting and allow earlier discharge from the hospital.   The block is performed under sedation with ultrasound guidance prior to your procedure. Due to the sedation, your may or may not remember the block experience. The nerve block will begin to take effect anywhere from 5 to 30 minutes after being administered. You will be transported to the operating room from your surgery after the block is completed.   At the end of surgery, when the anesthesia wears off, you will notice a few things. Your may not be able to move or feel the part of your body targeted by the nerve block. These are normal experiences, and they will disappear as the block wears off.  If you had an interscalene nerve block performed (which is common for shoulder surgery), your voice can be very hoarse and you may feel that you are not able to take as deep a breath as you did  before surgery. Some patients may also notice a droopy eyelid on the affected side. These symptoms will resolve once the block wears off.  Pain control: The nerve block technique used is a single injection that can last anywhere from 1-3 days. The duration of the numbness can vary between individuals. After leaving the hospital, it is important that you begin to take your prescribed pain medication when you start to sense the nerve block wearing off. This will help you avoid unpleasant pain at the time the nerve block wears off, which can sometimes be in the middle of the night. The block will only cover pain in the areas targeted by the nerve block so if you experience surgical pain outside of that area, please take your prescribed pain medication. Management of the "numb area": After a nerve block, you cannot feel pain, pressure, or temperature in the affected area so there is an increased risk for injury. You should take extra care to protect the affected areas until sensation and movement returns. Please take caution to not come in contact with extremely hot or cold items because you will not be able to sense or protect yourself form the extremes of temperature.  You may  experience some persistent numbness after the procedure by most neurological deficits resolve over time and the incidence of serious long term neurological complications attributable to peripheral nerve blocks are relatively uncommon.  POLAR CARE INFORMATION  MassAdvertisement.it  How to use [employer] Polar Care [facility] Therapy System?  YouTube   ShippingScam.co.uk  OPERATING INSTRUCTIONS  Start the product With dry hands, connect the transformer to the electrical connection located on the top of the cooler. Next, plug the transformer into an appropriate electrical outlet. The unit will automatically start running at this point.  To stop the pump, disconnect electrical power.  Unplug to stop the product when not  in use. Unplugging the Polar Care unit turns it off. Always unplug immediately after use. Never leave it plugged in while unattended. Remove pad.    FIRST ADD WATER TO FILL LINE, THEN ICE---Replace ice when existing ice is almost melted  1 Discuss Treatment with your Licensed Health Care Practitioner and Use Only as Prescribed 2 Apply Insulation Barrier & Cold Therapy Pad 3 Check for Moisture 4 Inspect Skin Regularly  Tips and Trouble Shooting Usage Tips 1. Use cubed or chunked ice for optimal performance. 2. It is recommended to drain the Pad between uses. To drain the pad, hold the Pad upright with the hose pointed toward the ground. Depress the black plunger and allow water to drain out. 3. You may disconnect the Pad from the unit without removing the pad from the affected area by depressing the silver tabs on the hose coupling and gently pulling the hoses apart. The Pad and unit will seal itself and will not leak. Note: Some dripping during release is normal. 4. DO NOT RUN PUMP WITHOUT WATER! The pump in this unit is designed to run with water. Running the unit without water will cause permanent damage to the pump. 5. Unplug unit before removing lid.  TROUBLESHOOTING GUIDE Pump not running, Water not flowing to the pad, Pad is not getting cold 1. Make sure the transformer is plugged into the wall outlet. 2. Confirm that the ice and water are filled to the indicated levels. 3. Make sure there are no kinks in the pad. 4. Gently pull on the blue tube to make sure the tube/pad junction is straight. 5. Remove the pad from the treatment site and ll it while the pad is lying at; then reapply. 6. Confirm that the pad couplings are securely attached to the unit. Listen for the double clicks (Figure 1) to confirm the pad couplings are securely attached.  Leaks    Note: Some condensation on the lines, controller, and pads is unavoidable, especially in warmer climates. 1. If using a [employer] Polar Care  Cold Therapy unit with a detachable Cold Therapy Pad, and a leak exists (other than condensation on the lines) disconnect the pad couplings. Make sure the silver tabs on the couplings are depressed before reconnecting the pad to the pump hose; then confirm both sides of the coupling are properly clicked in. 2. If the coupling continues to leak or a leak is detected in the pad itself, stop using it and call [employer] Customer Care at [phone].  Cleaning After use, empty and dry the unit with a soft cloth. Warm water and mild detergent may be used occasionally to clean the pump and tubes.  WARNING: The Polar Care Cube can be cold enough to cause serious injury, including full skin necrosis. Follow these Operating Instructions, and carefully read the Product Insert (see pouch on  side of unit) and the Cold Therapy Pad Fitting Instructions (provided with each Cold Therapy Pad) prior to use.

## 2023-03-14 ENCOUNTER — Encounter: Payer: Self-pay | Admitting: Orthopedic Surgery

## 2023-03-14 NOTE — Anesthesia Preprocedure Evaluation (Addendum)
Anesthesia Evaluation  Patient identified by MRN, date of birth, ID band Patient awake    Reviewed: Allergy & Precautions, NPO status , Patient's Chart, lab work & pertinent test results  History of Anesthesia Complications Negative for: history of anesthetic complications  Airway Mallampati: III  TM Distance: >3 FB Neck ROM: full    Dental  (+) Chipped   Pulmonary neg pulmonary ROS, neg shortness of breath   Pulmonary exam normal        Cardiovascular Exercise Tolerance: Good hypertension, (-) angina Normal cardiovascular exam     Neuro/Psych negative neurological ROS  negative psych ROS   GI/Hepatic negative GI ROS, Neg liver ROS,neg GERD  ,,  Endo/Other  diabetes, Type 2    Renal/GU      Musculoskeletal   Abdominal   Peds  Hematology negative hematology ROS (+)   Anesthesia Other Findings Diabetes mellitus without complication  DVT (deep venous thrombosis)  [facility] spotted fever Hypertension    Reproductive/Obstetrics negative OB ROS                             Anesthesia Physical Anesthesia Plan  ASA: 3  Anesthesia Plan: General ETT   Post-op Pain Management: Regional block   Induction: Intravenous  PONV Risk Score and Plan: Ondansetron, Dexamethasone, Midazolam and Treatment may vary due to age or medical condition  Airway Management Planned: Oral ETT  Additional Equipment:   Intra-op Plan:   Post-operative Plan: Extubation in OR  Informed Consent: I have reviewed the patients History and Physical, chart, labs and discussed the procedure including the risks, benefits and alternatives for the proposed anesthesia with the patient or authorized representative who has indicated his/her understanding and acceptance.     Dental Advisory Given  Plan Discussed with: Anesthesiologist, CRNA and Surgeon  Anesthesia Plan Comments: (Patient consented for risks of  anesthesia including but not limited to:  - adverse reactions to medications - damage to eyes, teeth, lips or other oral mucosa - nerve damage due to positioning  - sore throat or hoarseness - Damage to heart, brain, nerves, lungs, other parts of body or loss of life  Patient voiced understanding.)        Anesthesia Quick Evaluation

## 2023-03-22 ENCOUNTER — Other Ambulatory Visit: Payer: Self-pay

## 2023-03-22 ENCOUNTER — Ambulatory Visit: Payer: BC Managed Care – PPO | Admitting: Anesthesiology

## 2023-03-22 ENCOUNTER — Encounter
Admission: RE | Disposition: A | Discharge status: Home / Self Care | Payer: Self-pay | Source: Home / Self Care | Attending: Orthopedic Surgery

## 2023-03-22 ENCOUNTER — Encounter: Payer: Self-pay | Admitting: Orthopedic Surgery

## 2023-03-22 ENCOUNTER — Ambulatory Visit
Admission: RE | Admit: 2023-03-22 | Discharge: 2023-03-22 | Disposition: A | Discharge status: Home / Self Care | Payer: BC Managed Care – PPO | Source: Home / Self Care | Attending: Orthopedic Surgery | Admitting: Orthopedic Surgery

## 2023-03-22 DIAGNOSIS — M75102 Unspecified rotator cuff tear or rupture of left shoulder, not specified as traumatic: Secondary | ICD-10-CM | POA: Diagnosis not present

## 2023-03-22 DIAGNOSIS — M25812 Other specified joint disorders, left shoulder: Secondary | ICD-10-CM | POA: Diagnosis not present

## 2023-03-22 DIAGNOSIS — I10 Essential (primary) hypertension: Secondary | ICD-10-CM | POA: Insufficient documentation

## 2023-03-22 DIAGNOSIS — M19012 Primary osteoarthritis, left shoulder: Secondary | ICD-10-CM | POA: Insufficient documentation

## 2023-03-22 DIAGNOSIS — M7522 Bicipital tendinitis, left shoulder: Secondary | ICD-10-CM | POA: Diagnosis not present

## 2023-03-22 DIAGNOSIS — E119 Type 2 diabetes mellitus without complications: Secondary | ICD-10-CM | POA: Diagnosis not present

## 2023-03-22 DIAGNOSIS — Z86718 Personal history of other venous thrombosis and embolism: Secondary | ICD-10-CM | POA: Diagnosis not present

## 2023-03-22 HISTORY — DX: Essential (primary) hypertension: I10

## 2023-03-22 LAB — GLUCOSE, CAPILLARY: Glucose-Capillary: 139 mg/dL — ABNORMAL HIGH (ref 70–99)

## 2023-03-22 SURGERY — SHOULDER ARTHROSCOPY WITH SUBACROMIAL DECOMPRESSION AND DISTAL CLAVICLE EXCISION
Anesthesia: General | Site: Shoulder | Laterality: Left

## 2023-03-22 MED ORDER — LACTATED RINGERS IV SOLN
INTRAVENOUS | Status: DC | PRN
  Administered 2023-03-22: 12000 mL

## 2023-03-22 MED ORDER — PROPOFOL 10 MG/ML IV BOLUS
INTRAVENOUS | Status: DC | PRN
  Administered 2023-03-22: 200 mg via INTRAVENOUS

## 2023-03-22 MED ORDER — ONDANSETRON 4 MG PO TBDP: 4.0000 mg | ORAL_TABLET | Freq: Three times a day (TID) | ORAL | 0 refills | Status: AC | PRN

## 2023-03-22 MED ORDER — DEXAMETHASONE SODIUM PHOSPHATE 4 MG/ML IJ SOLN
INTRAMUSCULAR | Status: DC | PRN
  Administered 2023-03-22: 4 mg via INTRAVENOUS

## 2023-03-22 MED ORDER — FENTANYL CITRATE PF 50 MCG/ML IJ SOSY
50.0000 ug | PREFILLED_SYRINGE | INTRAMUSCULAR | Status: DC | PRN
  Administered 2023-03-22: 50 ug via INTRAVENOUS

## 2023-03-22 MED ORDER — LIDOCAINE HCL (CARDIAC) PF 100 MG/5ML IV SOSY
PREFILLED_SYRINGE | INTRAVENOUS | Status: DC | PRN
  Administered 2023-03-22: 100 mg via INTRATRACHEAL

## 2023-03-22 MED ORDER — OXYCODONE HCL 5 MG PO TABS: 5.0000 mg | ORAL_TABLET | ORAL | 0 refills | Status: AC | PRN

## 2023-03-22 MED ORDER — MIDAZOLAM HCL 2 MG/2ML IJ SOLN
1.0000 mg | INTRAMUSCULAR | Status: AC | PRN
  Administered 2023-03-22 (×2): 1 mg via INTRAVENOUS

## 2023-03-22 MED ORDER — ASPIRIN 325 MG PO TBEC: 325.0000 mg | DELAYED_RELEASE_TABLET | Freq: Every day | ORAL | 0 refills | Status: AC

## 2023-03-22 MED ORDER — DEXMEDETOMIDINE HCL IN NACL 400 MCG/100ML IV SOLN
INTRAVENOUS | Status: DC | PRN
  Administered 2023-03-22 (×5): 4 ug via INTRAVENOUS

## 2023-03-22 MED ORDER — CEFAZOLIN SODIUM-DEXTROSE 2-4 GM/100ML-% IV SOLN
2.0000 g | INTRAVENOUS | Status: AC
  Administered 2023-03-22: 2 g via INTRAVENOUS

## 2023-03-22 MED ORDER — ONDANSETRON HCL 4 MG/2ML IJ SOLN
INTRAMUSCULAR | Status: DC | PRN
  Administered 2023-03-22: 4 mg via INTRAVENOUS

## 2023-03-22 MED ORDER — LACTATED RINGERS IV SOLN: INTRAVENOUS | Status: DC

## 2023-03-22 MED ORDER — ACETAMINOPHEN 500 MG PO TABS: 1000.0000 mg | ORAL_TABLET | Freq: Three times a day (TID) | ORAL | 2 refills | Status: AC

## 2023-03-22 MED ORDER — BUPIVACAINE HCL (PF) 0.5 % IJ SOLN
INTRAMUSCULAR | Status: DC | PRN
  Administered 2023-03-22: 7 mL via PERINEURAL
  Administered 2023-03-22: 3 mL via PERINEURAL

## 2023-03-22 MED ORDER — SUCCINYLCHOLINE CHLORIDE 200 MG/10ML IV SOSY
PREFILLED_SYRINGE | INTRAVENOUS | Status: DC | PRN
  Administered 2023-03-22: 100 mg via INTRAVENOUS

## 2023-03-22 MED ORDER — BUPIVACAINE LIPOSOME 1.3 % IJ SUSP
INTRAMUSCULAR | Status: DC | PRN
  Administered 2023-03-22: 13 mL via PERINEURAL
  Administered 2023-03-22: 7 mL via PERINEURAL

## 2023-03-22 MED ORDER — LACTATED RINGERS IR SOLN
Status: DC | PRN
  Administered 2023-03-22 (×2): 3000 mL

## 2023-03-22 SURGICAL SUPPLY — 60 items
ADH SKN CLS APL DERMABOND .7 (GAUZE/BANDAGES/DRESSINGS)
ADPR IRR PORT MULTIBAG TUBE (MISCELLANEOUS) ×1
ANCH SUT 2 SWLK 19.1 CLS EYLT (Anchor) ×1 IMPLANT
ANCH SUT 2.9 PUSHLOCK ANCH (Orthopedic Implant) ×1 IMPLANT
ANCH SUT 2X2.3 TAPE (Anchor) ×1 IMPLANT
ANCH SUT SHRT 12.5 CANN EYLT (Anchor) ×1 IMPLANT
ANCHOR 2.3 SP SGL 1.2 XBRAID (Anchor) IMPLANT
ANCHOR SUT BIOCOMP LK 2.9X12.5 (Anchor) IMPLANT
ANCHOR SWIVELOCK BIO 4.75X19.1 (Anchor) IMPLANT
APL PRP STRL LF DISP 70% ISPRP (MISCELLANEOUS) ×1
BLADE SHAVER 4.5X7 STR FR (MISCELLANEOUS) ×1 IMPLANT
BUR BR 5.5 WIDE MOUTH (BURR) ×1 IMPLANT
CANNULA PART THRD DISP 5.75X7 (CANNULA) IMPLANT
CANNULA PARTIAL THREAD 2X7 (CANNULA) IMPLANT
CANNULA TWIST IN 8.25X7CM (CANNULA) IMPLANT
CHLORAPREP W/TINT 26 (MISCELLANEOUS) ×1 IMPLANT
COOLER POLAR GLACIER W/PUMP (MISCELLANEOUS) ×1 IMPLANT
COVER LIGHT HANDLE UNIVERSAL (MISCELLANEOUS) ×2 IMPLANT
DERMABOND ADVANCED .7 DNX12 (GAUZE/BANDAGES/DRESSINGS) IMPLANT
DRAPE INCISE IOBAN 66X45 STRL (DRAPES) ×1 IMPLANT
DRAPE STERI 35X30 U-POUCH (DRAPES) ×1 IMPLANT
DRAPE U-SHAPE 48X52 POLY STRL (PACKS) ×1 IMPLANT
DRSG TEGADERM 4X4.75 (GAUZE/BANDAGES/DRESSINGS) ×3 IMPLANT
ELECT REM PT RETURN 9FT ADLT (ELECTROSURGICAL)
ELECTRODE REM PT RTRN 9FT ADLT (ELECTROSURGICAL) IMPLANT
GAUZE SPONGE 4X4 12PLY STRL (GAUZE/BANDAGES/DRESSINGS) ×1 IMPLANT
GAUZE XEROFORM 1X8 LF (GAUZE/BANDAGES/DRESSINGS) ×1 IMPLANT
GLOVE SRG 8 PF TXTR STRL LF DI (GLOVE) ×3 IMPLANT
GLOVE SURG ENC MOIS LTX SZ7.5 (GLOVE) ×4 IMPLANT
GLOVE SURG UNDER POLY LF SZ8 (GLOVE) ×3
GOWN STRL REIN 2XL XLG LVL4 (GOWN DISPOSABLE) ×1 IMPLANT
GOWN STRL REUS W/ TWL LRG LVL3 (GOWN DISPOSABLE) ×3 IMPLANT
GOWN STRL REUS W/TWL LRG LVL3 (GOWN DISPOSABLE) ×3
IV LACTATED RINGER IRRG 3000ML (IV SOLUTION) ×4
IV LR IRRIG 3000ML ARTHROMATIC (IV SOLUTION) ×4 IMPLANT
KIT STABILIZATION SHOULDER (MISCELLANEOUS) ×1 IMPLANT
KIT TURNOVER KIT A (KITS) ×1 IMPLANT
MANIFOLD NEPTUNE II (INSTRUMENTS) ×1 IMPLANT
MASK FACE SPIDER DISP (MASK) ×1 IMPLANT
MAT GRAY ABSORB FLUID 28X50 (MISCELLANEOUS) ×2 IMPLANT
PACK ARTHROSCOPY SHOULDER (MISCELLANEOUS) ×1 IMPLANT
PAD ABD DERMACEA PRESS 5X9 (GAUZE/BANDAGES/DRESSINGS) ×2 IMPLANT
PAD WRAPON POLAR SHDR XLG (MISCELLANEOUS) ×1 IMPLANT
PASSER SUT FIRSTPASS SELF (INSTRUMENTS) IMPLANT
SET Y ADAPTER MULIT-BAG IRRIG (MISCELLANEOUS) ×1 IMPLANT
SPONGE T-LAP 18X18 ~~LOC~~+RFID (SPONGE) ×1 IMPLANT
SUT ETHILON 3-0 FS-10 30 BLK (SUTURE) ×1
SUT MNCRL 4-0 (SUTURE)
SUT MNCRL 4-0 27XMFL (SUTURE)
SUT VIC AB 0 CT1 36 (SUTURE) ×1 IMPLANT
SUT VIC AB 2-0 CT2 27 (SUTURE) IMPLANT
SUTURE EHLN 3-0 FS-10 30 BLK (SUTURE) ×1 IMPLANT
SUTURE MNCRL 4-0 27XMF (SUTURE) IMPLANT
SYSTEM IMPL TENODESIS LNT 2.9 (Orthopedic Implant) IMPLANT
TAPE MICROFOAM 4IN (TAPE) ×1 IMPLANT
TUBING CONNECTING 10 (TUBING) ×1 IMPLANT
TUBING INFLOW SET DBFLO PUMP (TUBING) ×1 IMPLANT
TUBING OUTFLOW SET DBLFO PUMP (TUBING) ×1 IMPLANT
WAND WEREWOLF FLOW 90D (MISCELLANEOUS) ×1 IMPLANT
WRAPON POLAR PAD SHDR XLG (MISCELLANEOUS) ×1

## 2023-03-22 NOTE — Anesthesia Procedure Notes (Signed)
Procedure Name: Intubation Date/Time: 03/22/2023 7:47 AM  Performed by: Barbette Hair, CRNAPre-anesthesia Checklist: Patient identified, Emergency Drugs available, Suction available, Patient being monitored and Timeout performed Patient Re-evaluated:Patient Re-evaluated prior to induction Oxygen Delivery Method: Circle system utilized Preoxygenation: Pre-oxygenation with 100% oxygen Induction Type: IV induction Ventilation: Mask ventilation without difficulty Laryngoscope Size: Mac and 4 Grade View: Grade II Tube type: Oral Tube size: 7.5 mm Number of attempts: 1 Airway Equipment and Method: Stylet Placement Confirmation: ETT inserted through vocal cords under direct vision, positive ETCO2, CO2 detector and breath sounds checked- equal and bilateral Secured at: 23 cm Tube secured with: Tape Dental Injury: Teeth and Oropharynx as per pre-operative assessment

## 2023-03-22 NOTE — Anesthesia Postprocedure Evaluation (Signed)
Anesthesia Post Note  Patient: Gilbert Walker  Procedure(s) Performed: Left shoulder arthroscopic rotator cuff repair, subacromial decompression, distal clavicle excision, and biceps tenodesis (Left: Shoulder)  Patient location during evaluation: PACU Anesthesia Type: General Level of consciousness: awake and alert Pain management: pain level controlled Vital Signs Assessment: post-procedure vital signs reviewed and stable Respiratory status: spontaneous breathing, nonlabored ventilation, respiratory function stable and patient connected to nasal cannula oxygen Cardiovascular status: blood pressure returned to baseline and stable Postop Assessment: no apparent nausea or vomiting Anesthetic complications: no   No notable events documented.   Last Vitals:  Vitals:   03/22/23 0952 03/22/23 1002  BP: 134/77 124/73  Pulse:  66  Resp:  15  Temp: (!) 36.4 C   SpO2:  97%    Last Pain:  Vitals:   03/22/23 0952  TempSrc:   PainSc: 0-No pain                 Cleda Mccreedy Burgess Sheriff

## 2023-03-22 NOTE — Anesthesia Procedure Notes (Signed)
Anesthesia Regional Block: Interscalene brachial plexus block   Pre-Anesthetic Checklist: , timeout performed,  Correct Patient, Correct Site, Correct Laterality,  Correct Procedure, Correct Position, site marked,  Risks and benefits discussed,  Surgical consent,  Pre-op evaluation,  At surgeon's request and post-op pain management  Laterality: Upper and Left  Prep: chloraprep       Needles:  Injection technique: Single-shot  Needle Type: Stimiplex     Needle Length: 9cm  Needle Gauge: 22     Additional Needles:   Procedures:,,,, ultrasound used (permanent image in chart),,    Narrative:  Start time: 03/22/2023 7:24 AM End time: 03/22/2023 7:26 AM Injection made incrementally with aspirations every 5 mL.  Performed by: Personally   Additional Notes: Patient consented for risk and benefits of nerve block including but not limited to nerve damage, Horner's syndrome, failed block, bleeding and infection.  Patient voiced understanding.  Functioning IV was confirmed and monitors were applied.  Timeout done prior to procedure and prior to any sedation being given to the patient.  Patient confirmed procedure site prior to any sedation given to the patient.  A 50mm 22ga Stimuplex needle was used. Sterile prep,hand hygiene and sterile gloves were used.  Minimal sedation used for procedure.  No paresthesia endorsed by patient during the procedure.  Negative aspiration and negative test dose prior to incremental administration of local anesthetic. The patient tolerated the procedure well with no immediate complications.

## 2023-03-22 NOTE — H&P (Signed)
Paper H&P to be scanned into permanent record. H&P reviewed. No significant changes noted.  

## 2023-03-22 NOTE — Transfer of Care (Signed)
Immediate Anesthesia Transfer of Care Note  Patient: Gilbert Walker Staples  Procedure(s) Performed: Left shoulder arthroscopic rotator cuff repair, subacromial decompression, distal clavicle excision, and biceps tenodesis (Left: Shoulder)  Patient Location: PACU  Anesthesia Type: General ETT  Level of Consciousness: awake, alert  and patient cooperative  Airway and Oxygen Therapy: Patient Spontanous Breathing and Patient connected to supplemental oxygen  Post-op Assessment: Post-op Vital signs reviewed, Patient's Cardiovascular Status Stable, Respiratory Function Stable, Patent Airway and No signs of Nausea or vomiting  Post-op Vital Signs: Reviewed and stable  Complications: No notable events documented.

## 2023-03-22 NOTE — Progress Notes (Signed)
Assisted [place] ANMD with left, interscalene , ultrasound guided block. Side rails up, monitors on throughout procedure. See vital signs in flow sheet. Tolerated Procedure well.

## 2023-03-22 NOTE — Op Note (Signed)
SURGERY DATE: 03/22/2023   PRE-OP DIAGNOSIS:  1. Left subacromial impingement 2. Left biceps tendinopathy 3. Left rotator cuff tear 4. Left acromioclavicular joint arthritis  POST-OP DIAGNOSIS: 1. Left subacromial impingement 2. Left biceps tendinopathy 3. Left rotator cuff tear 4. Left acromioclavicular joint arthritis  PROCEDURES:  1. Left arthroscopic rotator cuff repair 2. Left arthroscopic biceps tenodesis 3. Left arthroscopic subacromial decompression 4. Left arthroscopic extensive debridement of shoulder (glenohumeral and subacromial spaces) 5. Left arthroscopic distal clavicle excision   SURGEON: Rosealee Albee, MD   ASSISTANT: Sonny Dandy, PA   ANESTHESIA: Gen with Exparel interscalene block   ESTIMATED BLOOD LOSS: 5cc   DRAINS:  none   TOTAL IV FLUIDS: per anesthesia      SPECIMENS: none   IMPLANTS:  - Arthrex 2.70mm PushLock x 2 - Arthrex 4.39mm SwiveLock x 1 - Iconix SPEED double loaded with 1.2 and 2.37mm tape x 1     OPERATIVE FINDINGS:  Examination under anesthesia: A careful examination under anesthesia was performed.  Passive range of motion was: FF: 150; ER at side: 45; ER in abduction: 90; IR in abduction: 45.  Anterior load shift: NT.  Posterior load shift: NT.  Sulcus in neutral: NT.  Sulcus in ER: NT.     Intra-operative findings: A thorough arthroscopic examination of the shoulder was performed.  The findings are: 1. Biceps tendon: tendinopathy with significant erythema  2. Superior labrum: erythema and significant degenerative fraying 3. Posterior labrum and capsule: normal 4. Inferior capsule and inferior recess: normal 5. Glenoid cartilage surface: Normal 6. Supraspinatus attachment: High-grade partial-thickness bursal sided tear of the supraspinatus involving approximately 75% of the footprint 7. Posterior rotator cuff attachment: normal 8. Humeral head articular cartilage: normal 9. Rotator interval: significant synovitis 10:  Subscapularis tendon: attachment intact 11. Anterior labrum: Mildly degenerative 12. IGHL: normal   OPERATIVE REPORT:    Indications for procedure:  Gilbert Walker is a 57 y.o. male with approximately 8 months of left shoulder pain.  He has had difficulty with overhead motion since that time with sensations of weakness.   Clinical exam and MRI were suggestive of rotator cuff tear, biceps tendinopathy, acromioclavicular joint arthritis and subacromial impingement.  He has failed extensive nonoperative management.  After discussion of risks, benefits, and alternatives to surgery, the patient elected to proceed.    Procedure in detail:   I identified Larrell A Zingale in the pre-operative holding area.  I marked the operative shoulder with my initials. I reviewed the risks and benefits of the proposed surgical intervention, and the patient wished to proceed.  Anesthesia was then performed with an Exparel interscalene block.  The patient was transferred to the operative suite and placed in the beach chair position.     Appropriate IV antibiotics were administered prior to incision. The operative upper extremity was then prepped and draped in standard fashion. A time out was performed confirming the correct extremity, correct patient, and correct procedure.    I then created a standard posterior portal with an 11 blade. The glenohumeral joint was easily entered with a blunt trocar and the arthroscope introduced. The findings of diagnostic arthroscopy are described above. I debrided degenerative tissue including the synovitic tissue about the rotator interval and anterior and superior labrum. I then coagulated the inflamed synovium to obtain hemostasis and reduce the risk of post-operative swelling using an Arthrocare radiofrequency device.   I then turned my attention to the arthroscopic biceps tenodesis. The Loop n Tack technique was  used to pass a FiberTape through the biceps in a locked fashion adjacent  to the biceps anchor.  A hole for a 2.9 mm Arthrex PushLock was drilled in the bicipital groove just superior to the subscapularis tendon insertion.  The biceps tendon was then cut and the biceps anchor complex was debrided down to a stable base on the superior labrum.  The FiberTape was loaded onto the PushLock anchor and impacted into place into the previously drilled hole in the bicipital groove.  This appropriately secured the biceps into the bicipital groove and took it off of tension.   Next, the arthroscope was then introduced into the subacromial space. A direct lateral portal was created with an 11-blade after spinal needle localization. An extensive subacromial bursectomy and debridement was performed using a combination of the shaver and Arthrocare wand. The entire acromial undersurface was exposed and the CA ligament was subperiosteally elevated to expose the anterior acromial hook. A burr was used to create a flat anterior and lateral aspect of the acromion, converting it from a Type 2 to a Type 1 acromion. Care was made to keep the deltoid fascia intact.  I then turned my attention to the arthroscopic distal clavicle excision. I identified the acromioclavicular joint. Surrounding bursal tissue was debrided and the edges of the joint were identified. I used the 5.20mm barrel burr to remove the distal clavicle parallel to the edge of the acromion. I was able to fit two widths of the burr into the space between the distal clavicle and acromion, signifying that I had removed ~55mm of distal clavicle. This was confirmed by viewing anteriorly and introducing a probe with measuring marks from the lateral portal. Hemostasis was achieved with an Arthrocare wand.    Next, I created an accessory posterolateral portal to assist with visualization and instrumentation.  I debrided the poor quality edges of the supraspinatus tendon.  There was a significant bursal sided tear affecting approximately 75% of the  footprint.  I was able to easily push a switching stick into the glenohumeral joint through the remainder of the rotator cuff tissue.  This completed the tear to make it a full-thickness tear.  The result was a U-shaped tear of the supraspinatus.    I then percutaneously placed 1 Iconix SPEED medial row anchor at the articular margin. I then shuttled all 4 strands of tape through the rotator cuff just lateral to the musculotendinous junction using a FirstPass suture passer spanning the anterior to posterior extent of the tear.  The SPEED anchor inserter was used created channels along the rotator cuff footprint to vent the marrow and improve healing.  All 4 strands of suture were passed through an [employer] anchor.  This was placed approximately 2 cm distal to the lateral edge of the footprint in line with the midportion of the tear with appropriate tensioning of each suture prior to final fixation. There was 1 small dogear one anteriorly.  The repair stitch of the SwiveLock was passed through the anterior dogear and this was then passed into a PushLock anchor that was impacted into place just anterior to the SwiveLock anchor.  This construct allowed for excellent reapproximation of the rotator cuff to its native footprint without undue tension.  Appropriate compression was achieved. The repair was stable to external and internal rotation.   Fluid was evacuated from the shoulder, and the portals were closed with 3-0 Nylon. Xeroform was applied to the portals. A sterile dressing was applied, followed by a  Polar Care sleeve and a SlingShot shoulder immobilizer/sling. The patient was awakened from anesthesia without difficulty and was transferred to the PACU in stable condition.    Of note, assistance from a PA was essential to performing the surgery.  PA was present for the entire surgery.  PA assisted with patient positioning, retraction, instrumentation, and wound closure. The surgery would have been  more difficult and had longer operative time without PA assistance.   COMPLICATIONS: none   DISPOSITION: plan for discharge home after recovery in PACU     POSTOPERATIVE PLAN: Remain in sling (except hygiene and elbow/wrist/hand RoM exercises as instructed by PT) x 4 weeks and NWB for this time. PT to begin 3-4 days after surgery.  Small/medium rotator cuff repair rehab protocol. ASA 325mg  daily x 2 weeks for DVT ppx.

## 2024-01-17 ENCOUNTER — Other Ambulatory Visit: Payer: Self-pay | Admitting: Student

## 2024-01-17 ENCOUNTER — Ambulatory Visit
Admission: RE | Admit: 2024-01-17 | Discharge: 2024-01-17 | Disposition: A | Discharge status: Home / Self Care | Source: Ambulatory Visit | Attending: Student | Admitting: Student

## 2024-01-17 ENCOUNTER — Encounter: Payer: Self-pay | Admitting: Student

## 2024-01-17 DIAGNOSIS — N5089 Other specified disorders of the male genital organs: Secondary | ICD-10-CM
